# Patient Record
Sex: Male | Born: 1985 | Race: Black or African American | Hispanic: No | Marital: Married | Smoking: Current every day smoker
Health system: Southern US, Community
[De-identification: ages and names within clinical notes are randomized; demographics above are authoritative.]

## PROBLEM LIST (undated history)

## (undated) DIAGNOSIS — I10 Essential (primary) hypertension: Secondary | ICD-10-CM

## (undated) HISTORY — PX: INCISION AND DRAINAGE ABSCESS: SHX5864

---

## 2020-08-31 ENCOUNTER — Emergency Department (INDEPENDENT_AMBULATORY_CARE_PROVIDER_SITE_OTHER): Payer: Self-pay

## 2020-08-31 ENCOUNTER — Emergency Department
Admission: RE | Admit: 2020-08-31 | Discharge: 2020-08-31 | Disposition: A | Discharge status: Home / Self Care | Payer: Self-pay | Source: Ambulatory Visit

## 2020-08-31 ENCOUNTER — Other Ambulatory Visit: Payer: Self-pay

## 2020-08-31 VITALS — BP 162/110 | HR 76 | Temp 98.3°F | Resp 20 | Ht 67.0 in | Wt 195.0 lb

## 2020-08-31 DIAGNOSIS — M25532 Pain in left wrist: Secondary | ICD-10-CM

## 2020-08-31 DIAGNOSIS — X500XXA Overexertion from strenuous movement or load, initial encounter: Secondary | ICD-10-CM

## 2020-08-31 DIAGNOSIS — S63502A Unspecified sprain of left wrist, initial encounter: Secondary | ICD-10-CM

## 2020-08-31 HISTORY — DX: Essential (primary) hypertension: I10

## 2020-08-31 NOTE — ED Provider Notes (Signed)
[facility]   [id number] 08/31/20 Arrival Time: 1202  [id number] PAIN  SUBJECTIVE: History from: patient. Jeffery Flores is a 35 y.o. male complains of L wrist pain that began about 2 weeks ago. Reports that he was lifting something at work and that he felt something pull on the top of his left wrist. Describes the pain as constant and achy in character with intermittent sharp pain with lifting. Has not tried OTC medications without relief.  Symptoms are made worse with activity. Denies similar symptoms in the past. Denies fever, chills, erythema, ecchymosis, effusion, weakness, numbness and tingling, saddle paresthesias, loss of bowel or bladder function.      ROS: As per HPI.  All other pertinent ROS negative.     Past Medical History:  Diagnosis Date  . Hypertension    Past Surgical History:  Procedure Laterality Date  . INCISION AND DRAINAGE ABSCESS Right    eyebrow area   No Known Allergies No current facility-administered medications on file prior to encounter.   Current Outpatient Medications on File Prior to Encounter  Medication Sig Dispense Refill  . amLODipine (NORVASC) 5 MG tablet Take 5 mg by mouth daily. UNKNOWN DOSE OF THIS MED     Social History   Socioeconomic History  . Marital status: Married    Spouse name: Not on file  . Number of children: Not on file  . Years of education: Not on file  . Highest education level: Not on file  Occupational History  . Not on file  Tobacco Use  . Smoking status: Current Every Day Smoker    Packs/day: 0.50    Years: 20.00    Pack years: 10.00    Types: Cigarettes  . Smokeless tobacco: Never Used  Vaping Use  . Vaping Use: Never used  Substance and Sexual Activity  . Alcohol use: Yes    Comment: occasionally  . Drug use: Yes    Types: Marijuana    Comment: daily  . Sexual activity: Not on file  Other Topics Concern  . Not on file  Social History Narrative  . Not on file   Social Determinants of  Health   Financial Resource Strain: Not on file  Food Insecurity: Not on file  Transportation Needs: Not on file  Physical Activity: Not on file  Stress: Not on file  Social Connections: Not on file  Intimate Partner Violence: Not on file   Family History  Problem Relation Age of Onset  . Lupus Mother   . Gallstones Father     OBJECTIVE:  Vitals:   08/31/20 1219 08/31/20 1221 08/31/20 1223  BP:  (!) 155/106 (!) 162/110  Pulse:  76   Resp:  20   Temp:  98.3 F (36.8 C)   SpO2:  99%   Weight: 195 lb (88.5 kg)    Height: 5\' 7"  (1.702 m)      General appearance: ALERT; in no acute distress.  Head: NCAT Lungs: Normal respiratory effort CV: pulses 2+ bilaterally. Cap refill < 2 seconds Musculoskeletal:  Inspection: Skin warm, dry, clear and intact No erythema, effusion noted Palpation: tender to palpation along radial dorsum of the L wrist ROM: Limited ROM active and passive to L wrist Skin: warm and dry Neurologic: Ambulates without difficulty; Sensation intact about the upper/ lower extremities Psychological: alert and cooperative; normal mood and affect  DIAGNOSTIC STUDIES:  DG Wrist Complete Left  Result Date: 08/31/2020 CLINICAL DATA:  35 year old male with left wrist pain for the  past 2 weeks. No known injury. EXAM: LEFT WRIST - COMPLETE 3+ VIEW COMPARISON:  None. FINDINGS: There is no evidence of fracture or dislocation. There is no evidence of arthropathy or other focal bone abnormality. Soft tissues are unremarkable. IMPRESSION: Negative. Electronically Signed   By: Malachy Moan M.D.   On: 08/31/2020 12:47     ASSESSMENT & PLAN:  1. Sprain of left wrist, initial encounter   2. Acute pain of left wrist     Xray was negative for fracture or misalignment Wrist brace applied in office today Continue conservative management of rest, ice, and gentle stretches Take ibuprofen as needed for pain relief (may cause abdominal discomfort, ulcers, and GI bleeds  avoid taking with other NSAIDs) Follow up with sports medicine if symptoms persist Return or go to the ER if you have any new or worsening symptoms (fever, chills, chest pain, abdominal pain, changes in bowel or bladder habits, pain radiating into lower legs)  Work note provided Reviewed expectations re: course of current medical issues. Questions answered. Outlined signs and symptoms indicating need for more acute intervention. Patient verbalized understanding. After Visit Summary given.       Moshe Cipro, NP 08/31/20 1302

## 2020-08-31 NOTE — ED Triage Notes (Signed)
Pt presents to Urgent Care with c/o L wrist pain following incident at work 2 weeks ago. Reports he was lifting a tub of meat and he felt a pulling sensation in his L wrist; reports swelling initially.

## 2020-08-31 NOTE — Discharge Instructions (Signed)
Your xray today is negative for any fracture or misalignment  May take ibuprofen/tylenol as needed for pain  May use ice to the area  If symptoms persist, follow up with sports medicine

## 2021-04-01 DEATH — deceased

## 2022-09-19 IMAGING — DX DG WRIST COMPLETE 3+V*L*
4 series · 4 of 4 positions shown · non-contrast
Comparison: None.

CLINICAL DATA: 34-year-old male with left wrist pain for the past 2
weeks. No known injury.

EXAM:
LEFT WRIST - COMPLETE 3+ VIEW

[wrist pa]
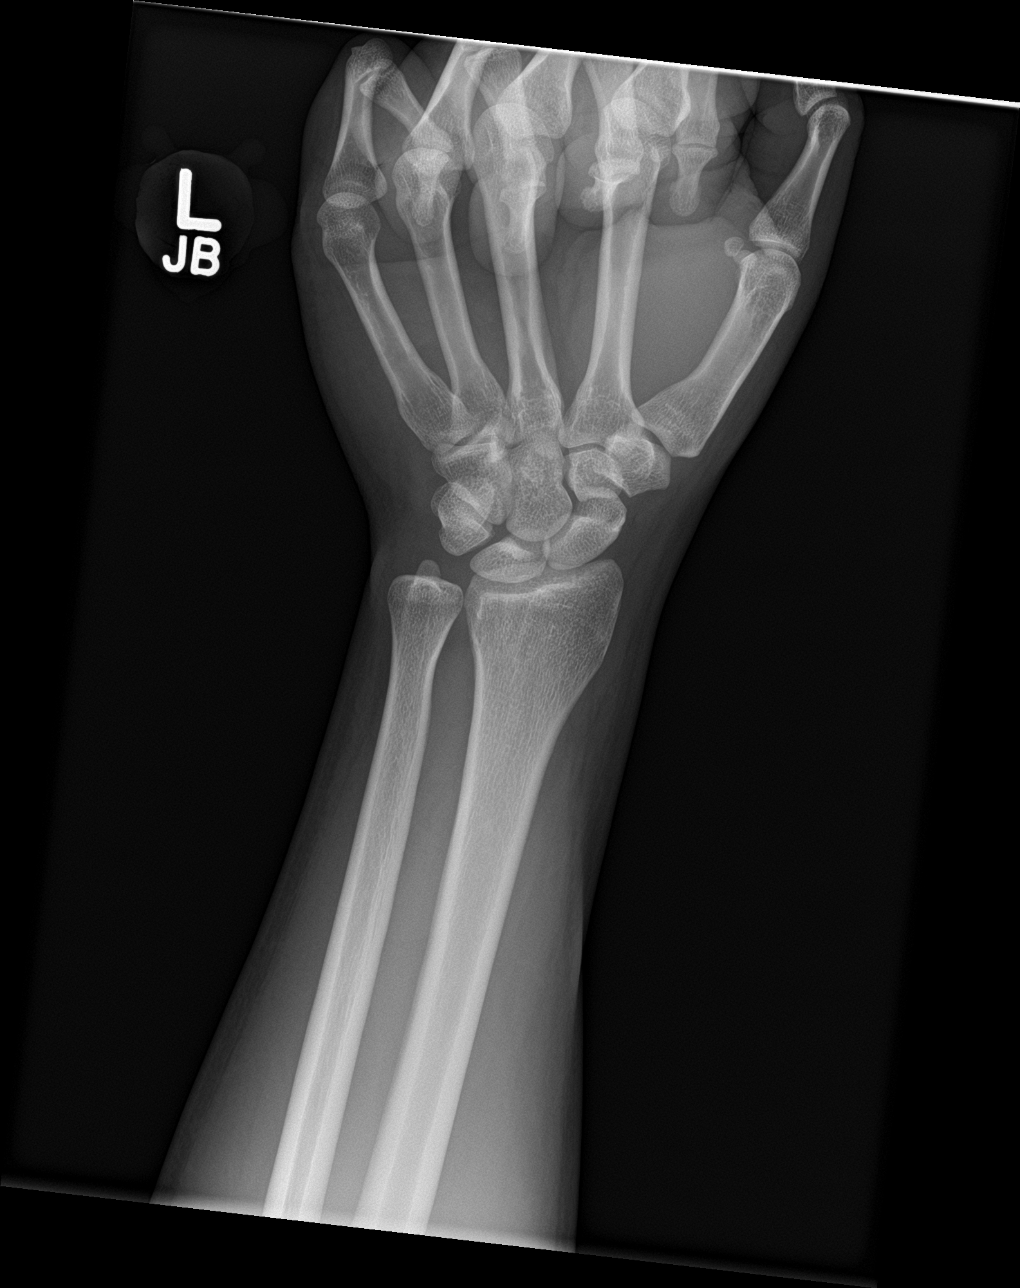

[wrist obl]
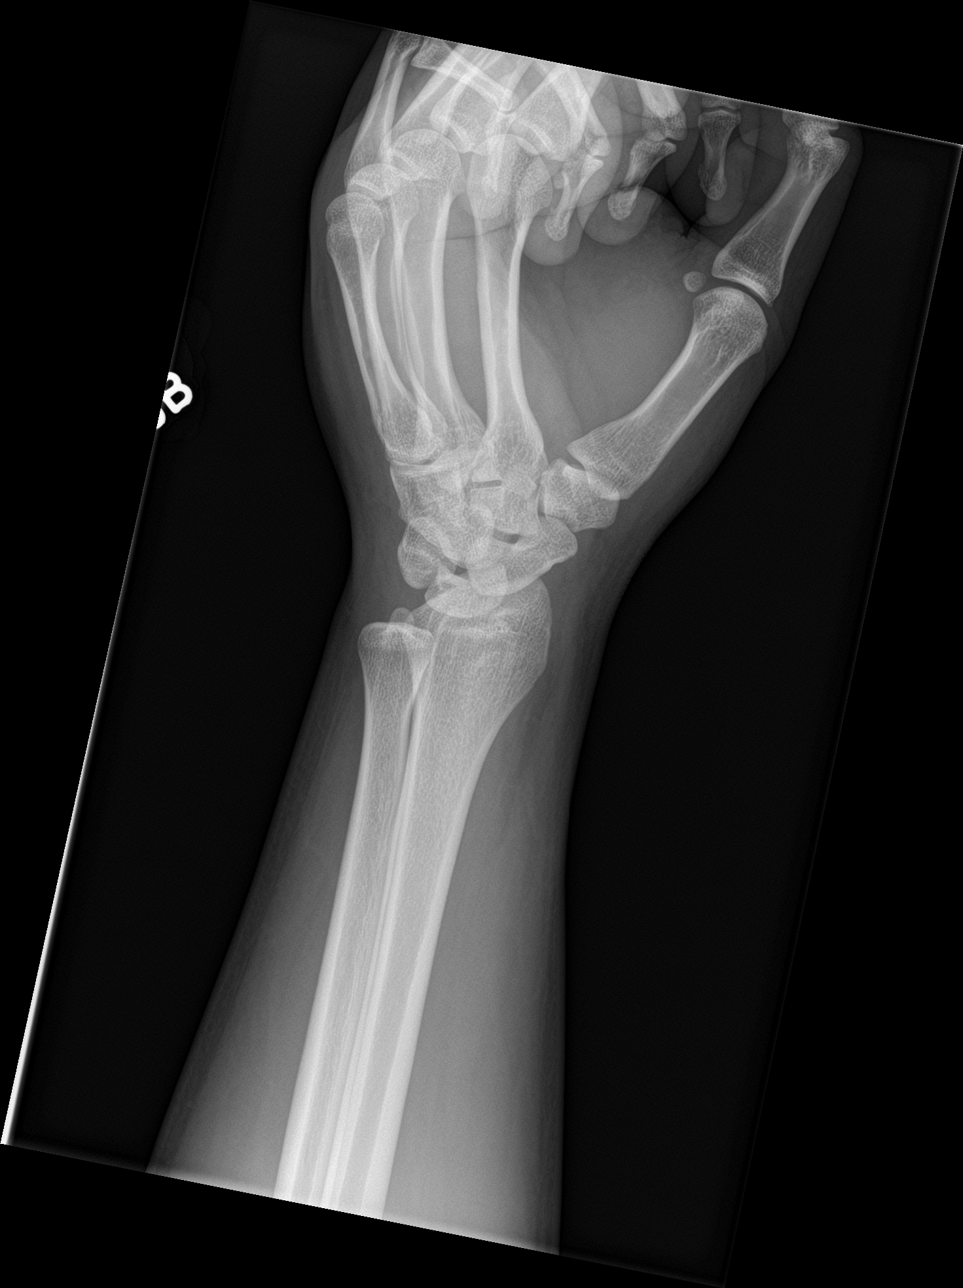

[wrist lat]
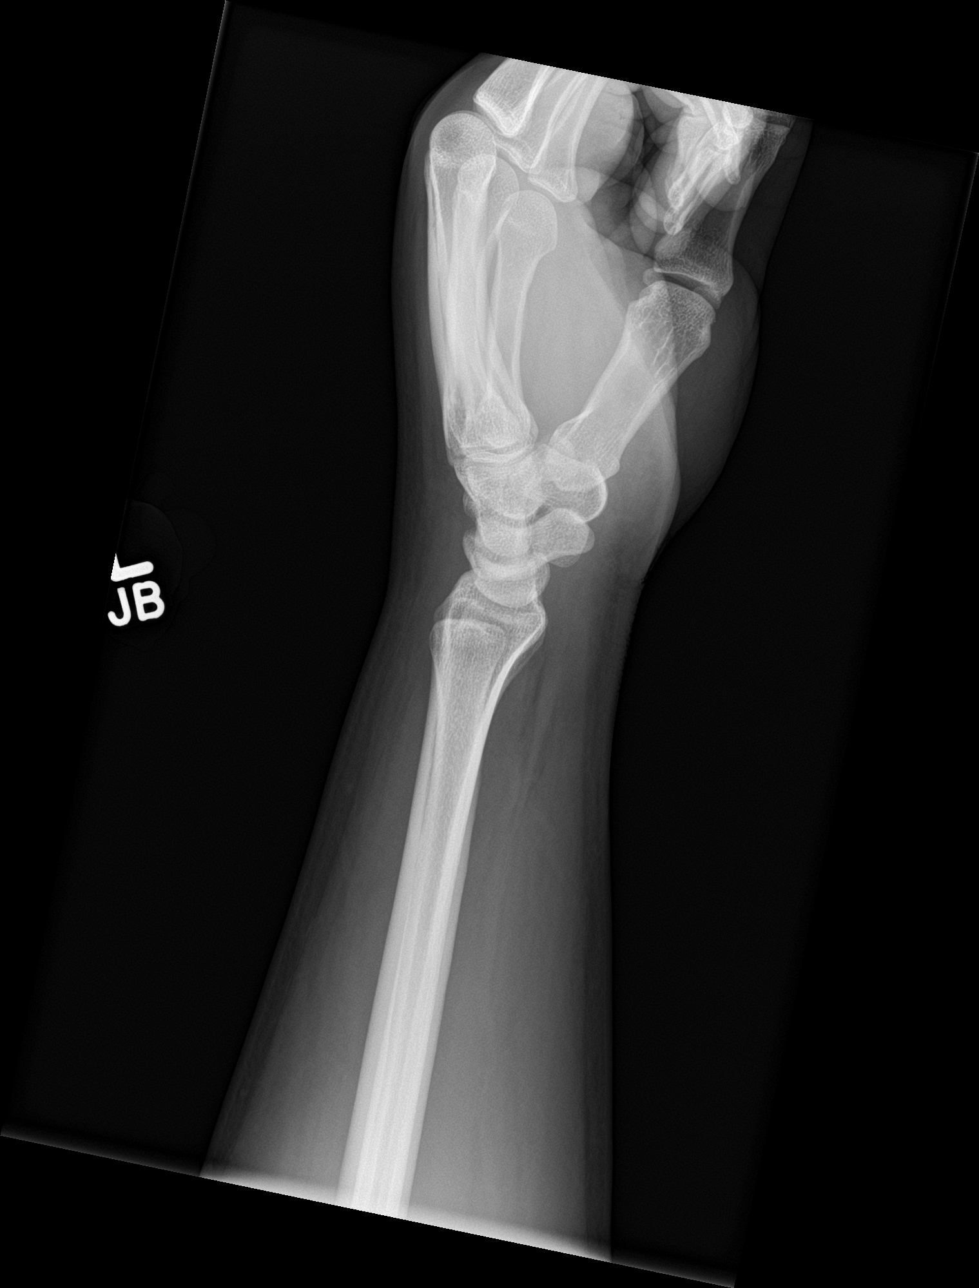

[wrist navicular]
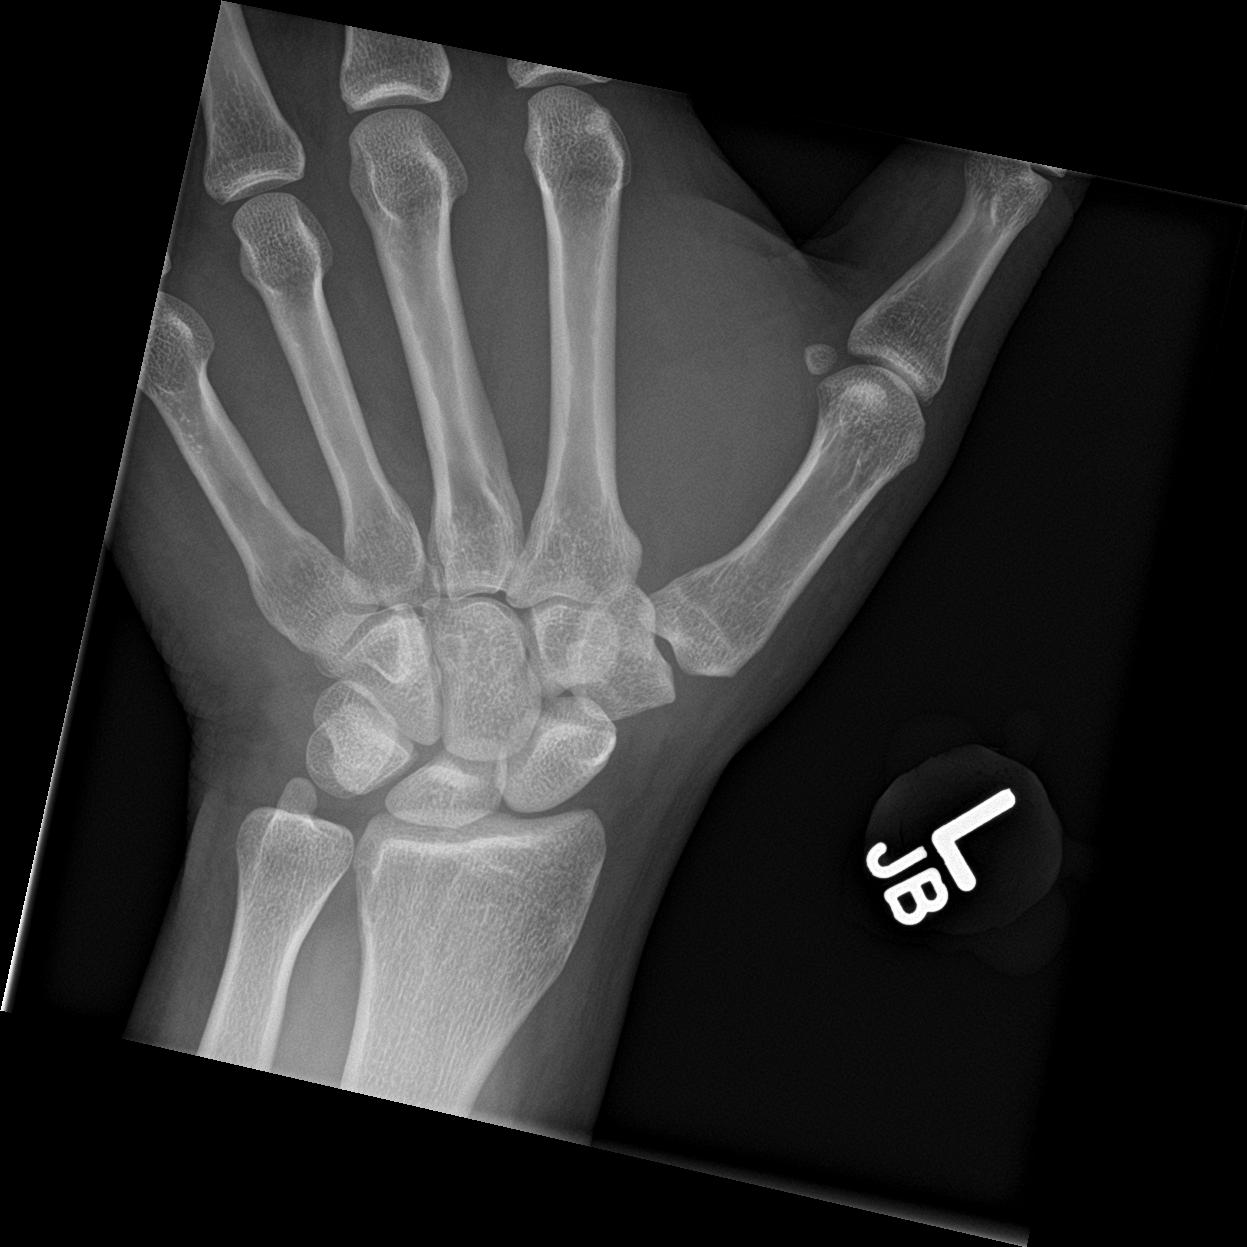

[4 of 4 positions shown; findings below may reference images not displayed]

FINDINGS: There is no evidence of fracture or dislocation. There is no
evidence of arthropathy or other focal bone abnormality. Soft
tissues are unremarkable.
IMPRESSION: Negative.
